# Patient Record
Sex: Male | Born: 1970 | Race: White | Hispanic: No | Smoking: Former smoker
Health system: Southern US, Community
[De-identification: ages and names within clinical notes are randomized; demographics above are authoritative.]

## PROBLEM LIST (undated history)

## (undated) DIAGNOSIS — K449 Diaphragmatic hernia without obstruction or gangrene: Secondary | ICD-10-CM

## (undated) DIAGNOSIS — K219 Gastro-esophageal reflux disease without esophagitis: Secondary | ICD-10-CM

## (undated) DIAGNOSIS — R81 Glycosuria: Secondary | ICD-10-CM

## (undated) DIAGNOSIS — K222 Esophageal obstruction: Secondary | ICD-10-CM

## (undated) HISTORY — DX: Esophageal obstruction: K22.2

## (undated) HISTORY — DX: Diaphragmatic hernia without obstruction or gangrene: K44.9

## (undated) HISTORY — PX: TONSILLECTOMY: SUR1361

## (undated) HISTORY — PX: FOOT SURGERY: SHX648

---

## 2006-07-27 ENCOUNTER — Ambulatory Visit (HOSPITAL_COMMUNITY): Admission: RE | Admit: 2006-07-27 | Discharge: 2006-07-27 | Payer: Self-pay | Admitting: Internal Medicine

## 2006-07-27 ENCOUNTER — Ambulatory Visit: Payer: Self-pay | Admitting: Internal Medicine

## 2006-07-29 ENCOUNTER — Inpatient Hospital Stay (HOSPITAL_BASED_OUTPATIENT_CLINIC_OR_DEPARTMENT_OTHER): Admission: RE | Admit: 2006-07-29 | Discharge: 2006-07-29 | Payer: Self-pay | Admitting: Internal Medicine

## 2006-07-29 ENCOUNTER — Ambulatory Visit: Payer: Self-pay | Admitting: Internal Medicine

## 2006-08-07 ENCOUNTER — Ambulatory Visit: Payer: Self-pay | Admitting: Cardiology

## 2013-01-21 ENCOUNTER — Encounter: Payer: Self-pay | Admitting: *Deleted

## 2013-01-21 ENCOUNTER — Encounter: Payer: Self-pay | Admitting: Gastroenterology

## 2013-01-21 ENCOUNTER — Emergency Department
Admission: EM | Admit: 2013-01-21 | Discharge: 2013-01-21 | Disposition: A | Discharge status: Home / Self Care | Payer: Self-pay | Source: Home / Self Care | Attending: Family Medicine | Admitting: Family Medicine

## 2013-01-21 ENCOUNTER — Other Ambulatory Visit (HOSPITAL_COMMUNITY): Payer: Managed Care, Other (non HMO)

## 2013-01-21 DIAGNOSIS — R1310 Dysphagia, unspecified: Secondary | ICD-10-CM

## 2013-01-21 HISTORY — DX: Gastro-esophageal reflux disease without esophagitis: K21.9

## 2013-01-21 HISTORY — DX: Glycosuria: R81

## 2013-01-21 MED ORDER — PANTOPRAZOLE SODIUM 40 MG PO TBEC: 40.0000 mg | DELAYED_RELEASE_TABLET | Freq: Two times a day (BID) | ORAL | Status: DC

## 2013-01-21 NOTE — ED Notes (Signed)
appt sch'ed with Dr. Jarold Motto GI for May 15 @ 0900. Pt notified of the appt.

## 2013-01-21 NOTE — ED Notes (Signed)
Pt sch'ed for Barium Swallow today @ 1300 at [facility]. Pt notified of the appt.

## 2013-01-21 NOTE — ED Notes (Signed)
Pt c/o dysphagia and GERD x . He has taken omeprazole 20mg  QD with minimal relief.

## 2013-01-21 NOTE — ED Provider Notes (Signed)
History     CSN: [id number]  Arrival date & time 01/21/13  0905   First MD Initiated Contact with Patient 01/21/13 [phone]      Chief Complaint  Patient presents with  . Dysphagia   HPI  Patient presents today with chief complaint of progressive dysphagia over the past 6 months. Patient states he has a history of poorly controlled reflux that he's been using intermittent over-the-counter medication for the past. Patient states she's had difficulty eating solid foods over the past 6 months has seemed to progressively worsen over the past month. Patient states that every time he tries to eat something like steak he gets a severe feeling of that the food is stuck in his throat and has had to drink high amounts of water to help clear the food. Patient has sometimes vomit the food to help clear it. Patient denies any fevers or chills. Patient is nonalcoholic. Quit smoking about 4-5 years ago. No unintentional weight loss.  patient states he's been using every other day Prilosec for symptoms in this has seemed to somewhat help, however he still has episodes where he has to wake up in the night and vomited because of too much stomach acid. Patient is not been formally evaluated by gastroenterology for this. Patient denies any issues swallowing liquids, issues been predominantly with solid foods. Past Medical History  Diagnosis Date  . Glycosuria   . GERD (gastroesophageal reflux disease)     Past Surgical History  Procedure Laterality Date  . Tonsillectomy    . Foot surgery Right     History reviewed. No pertinent family history.  History  Substance Use Topics  . Smoking status: Former Games developer  . Smokeless tobacco: Not on file  . Alcohol Use: Yes      Review of Systems  All other systems reviewed and are negative.    Allergies  Erythromycin  Home Medications   Current Outpatient Rx  Name  Route  Sig  Dispense  Refill  . ibuprofen (ADVIL,MOTRIN) 200 MG tablet   Oral  Take 200 mg by mouth every 6 (six) hours as needed for pain.         Marland Kitchen omeprazole (PRILOSEC) 20 MG capsule   Oral   Take 20 mg by mouth daily.           BP 135/83  Pulse 80  Temp(Src) 98.3 F (36.8 C) (Oral)  Resp 18  Ht 5' 9.75" (1.772 m)  Wt 195 lb (88.451 kg)  BMI 28.17 kg/m2  SpO2 98%  Physical Exam  Constitutional: He appears well-developed and well-nourished.  HENT:  Head: Normocephalic and atraumatic.  Right Ear: External ear normal.  Left Ear: External ear normal.  Mouth/Throat: Oropharynx is clear and moist.  Eyes: Pupils are equal, round, and reactive to light.  Neck: Normal range of motion. Neck supple. No tracheal deviation present. No thyromegaly present.  Cardiovascular: Normal rate and normal heart sounds.   Pulmonary/Chest: Effort normal.  Abdominal: Soft. Bowel sounds are normal. He exhibits no distension. There is no tenderness. There is no rebound and no guarding.  Musculoskeletal: Normal range of motion.  Neurological: He is alert.  Skin: Skin is warm.    ED Course  Procedures (including critical care time)  Labs Reviewed - No data to display No results found.   1. Dysphagia       MDM  Symptoms seem to be most consistent with esophageal dysphasia. Suspect that this is related to chronic heartburn. Differential diagnosis  also includes motility disorders. Will start workup with a barium swallow pending gastroenterology followup. Will place patient on high-dose protonix. as well as a full liquid diet pending GI followup. Discussed general and GI red flags. No hematemesis or other red flags on history/exam. Followup as needed.    The patient and/or caregiver has been counseled thoroughly with regard to treatment plan and/or medications prescribed including dosage, schedule, interactions, rationale for use, and possible side effects and they verbalize understanding. Diagnoses and expected course of recovery discussed and will return if not  improved as expected or if the condition worsens. Patient and/or caregiver verbalized understanding.            Doree Albee, MD 01/21/13 [phone]

## 2013-01-24 ENCOUNTER — Other Ambulatory Visit (HOSPITAL_COMMUNITY): Payer: Managed Care, Other (non HMO)

## 2013-01-24 ENCOUNTER — Ambulatory Visit (HOSPITAL_COMMUNITY): Payer: Managed Care, Other (non HMO)

## 2013-01-24 ENCOUNTER — Ambulatory Visit (HOSPITAL_COMMUNITY)
Admission: RE | Admit: 2013-01-24 | Discharge: 2013-01-24 | Disposition: A | Discharge status: Home / Self Care | Payer: Managed Care, Other (non HMO) | Source: Ambulatory Visit | Attending: Family Medicine | Admitting: Family Medicine

## 2013-01-24 DIAGNOSIS — K228 Other specified diseases of esophagus: Secondary | ICD-10-CM | POA: Insufficient documentation

## 2013-01-24 DIAGNOSIS — R1310 Dysphagia, unspecified: Secondary | ICD-10-CM | POA: Insufficient documentation

## 2013-01-24 DIAGNOSIS — K219 Gastro-esophageal reflux disease without esophagitis: Secondary | ICD-10-CM | POA: Insufficient documentation

## 2013-01-24 DIAGNOSIS — K2289 Other specified disease of esophagus: Secondary | ICD-10-CM | POA: Insufficient documentation

## 2013-02-01 ENCOUNTER — Encounter: Payer: Self-pay | Admitting: *Deleted

## 2013-02-10 ENCOUNTER — Encounter: Payer: Self-pay | Admitting: Gastroenterology

## 2013-02-10 ENCOUNTER — Other Ambulatory Visit (INDEPENDENT_AMBULATORY_CARE_PROVIDER_SITE_OTHER): Payer: Managed Care, Other (non HMO)

## 2013-02-10 ENCOUNTER — Ambulatory Visit (INDEPENDENT_AMBULATORY_CARE_PROVIDER_SITE_OTHER): Payer: Managed Care, Other (non HMO) | Admitting: Gastroenterology

## 2013-02-10 VITALS — BP 120/80 | HR 72 | Ht 69.5 in | Wt 197.5 lb

## 2013-02-10 DIAGNOSIS — K219 Gastro-esophageal reflux disease without esophagitis: Secondary | ICD-10-CM

## 2013-02-10 DIAGNOSIS — R1310 Dysphagia, unspecified: Secondary | ICD-10-CM

## 2013-02-10 LAB — COMPREHENSIVE METABOLIC PANEL
ALT: 29 U/L (ref 0–53)
Alkaline Phosphatase: 50 U/L (ref 39–117)
CO2: 31 mEq/L (ref 19–32)
Creatinine, Ser: 1 mg/dL (ref 0.4–1.5)
GFR: 86.17 mL/min (ref >60.00)
Total Bilirubin: 0.6 mg/dL (ref 0.3–1.2)

## 2013-02-10 LAB — CBC WITH DIFFERENTIAL/PLATELET
Eosinophils Relative: 2.4 % (ref 0.0–5.0)
HCT: 44.2 % (ref 39.0–52.0)
Hemoglobin: 15.3 g/dL (ref 13.0–17.0)
Lymphs Abs: 1.4 10*3/uL (ref 0.7–4.0)
Monocytes Relative: 8.1 % (ref 3.0–12.0)
Neutro Abs: 5.1 10*3/uL (ref 1.4–7.7)
RDW: 12.8 % (ref 11.5–14.6)
WBC: 7.3 10*3/uL (ref 4.5–10.5)

## 2013-02-10 LAB — IBC PANEL
Saturation Ratios: 22 % (ref 20.0–50.0)
Transferrin: 262.6 mg/dL (ref 212.0–360.0)

## 2013-02-10 LAB — VITAMIN B12: Vitamin B-12: 379 pg/mL (ref 211–911)

## 2013-02-10 LAB — HEPATIC FUNCTION PANEL
ALT: 29 U/L (ref 0–53)
Albumin: 4.2 g/dL (ref 3.5–5.2)
Bilirubin, Direct: 0.1 mg/dL (ref 0.0–0.3)
Total Protein: 7.1 g/dL (ref 6.0–8.3)

## 2013-02-10 LAB — FERRITIN: Ferritin: 85.7 ng/mL (ref 22.0–322.0)

## 2013-02-10 MED ORDER — DEXLANSOPRAZOLE 60 MG PO CPDR: 60.0000 mg | DELAYED_RELEASE_CAPSULE | Freq: Every day | ORAL | Status: DC

## 2013-02-10 NOTE — Patient Instructions (Addendum)
  You have been scheduled for an endoscopy with propofol. Please follow written instructions given to you at your visit today. If you use inhalers (even only as needed), please bring them with you on the day of your procedure. Your physician has requested that you go to www.startemmi.com and enter the access code given to you at your visit today. This web site gives a general overview about your procedure. However, you should still follow specific instructions given to you by our office regarding your preparation for the procedure.  Your physician has requested that you go to the basement for lab work before leaving today.   We have sent the following medications to your pharmacy for you to pick up at your convenience: Stop the Protonix's and start Dexilant. Dexilant 60 mg, please take one capsule by mouth once daily. _____________________________________________________________________________                                               We are excited to introduce MyChart, a new best-in-class service that provides you online access to important information in your electronic medical record. We want to make it easier for you to view your health information - all in one secure location - when and where you need it. We expect MyChart will enhance the quality of care and service we provide.  When you register for MyChart, you can:    View your test results.    Request appointments and receive appointment reminders via email.    Request medication renewals.    View your medical history, allergies, medications and immunizations.    Communicate with your physician's office through a password-protected site.    Conveniently print information such as your medication lists.  To find out if MyChart is right for you, please talk to a member of our clinical staff today. We will gladly answer your questions about this free health and wellness tool.  If you are age 39 or older and want a member  of your family to have access to your record, you must provide written consent by completing a proxy form available at our office. Please speak to our clinical staff about guidelines regarding accounts for patients younger than age 41.  As you activate your MyChart account and need any technical assistance, please call the MyChart technical support line at [phone]) or email your question to [email or web address]. If you email your question(s), please include your name, a return phone number and the best time to reach you.  If you have non-urgent health-related questions, you can send a message to our office through MyChart at [email or web address]. If you have a medical emergency, call 911.  Thank you for using MyChart as your new health and wellness resource!   MyChart licensed from [employer],  [phone]. Patents Pending.

## 2013-02-10 NOTE — Progress Notes (Addendum)
History of Present Illness:  This is a  very pleasant 42 year old Caucasian male who has had acid reflux for 20 years.  treated with  over-the-counter Prilosec with minimal success, also when necessary Pepcid AC.  Over the last 6 months he's had episodes of rather severe dysphagia for solid foods with associated choking and emesis.  Recent barium swallow showed what appeared to be a Schatzki's ring distal esophagus without other abnormalities.  Currently is on Protonix daily with rather marked improvement in his acid reflux symptoms of regurgitation and burning substernal chest pain, but he continues with nocturnal symptoms and also has to be very careful about his diet in terms of dysphagia difficulties.  5 years ago he has stress related chest pain, and apparently had a negative cardiac angiogram.  The patient did quit smoking 5 years ago, and denies ethanol abuse, but does take 2 Advil a day for various aches and pains.  There is no history of Raynaud's phenomenon or other chronic medical problems, but the patient does not go to the doctor very often.  Family history is remarkable for acid reflux in his grandmother.  There is no known history of gallbladder/ liver disease.  I have reviewed this patient's present history, medical and surgical past history, allergies and medications.     ROS:   All systems were reviewed and are negative unless otherwise stated in the HPI.    Physical Exam: Blood pressure 120/80, pulse 72 and regular, and weight 197 with a BMI of 28.76.  97% saturation on room air. General well developed well nourished patient in no acute distress, appearing their stated age Eyes PERRLA, no icterus, fundoscopic exam per opthamologist Skin no lesions noted Neck supple, no adenopathy, no thyroid enlargement, no tenderness Chest clear to percussion and auscultation Heart no significant murmurs, gallops or rubs noted Abdomen no hepatosplenomegaly masses or tenderness, BS normal.   Extremities no acute joint lesions, edema, phlebitis or evidence of cellulitis. Neurologic patient oriented x 3, cranial nerves intact, no focal neurologic deficits noted. Psychological mental status normal and normal affect.  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  I have switched him to Dexilant 60 mg a day, have shown him patient education material concerning GERD, and scheduled endoscopy with probable esophageal biopsy and dilatation.  He is been placed on a dysphagia diet pending further therapy.  I have ordered basic routine labs for review.  He has no symptoms of a neuromuscular disorder or Raynaud's phenomenon.  Encounter Diagnoses  Name Primary?  . GERD (gastroesophageal reflux disease) Yes  . Schatzki's ring

## 2013-02-23 ENCOUNTER — Encounter: Payer: Self-pay | Admitting: Gastroenterology

## 2013-02-23 ENCOUNTER — Other Ambulatory Visit: Payer: Self-pay | Admitting: *Deleted

## 2013-02-23 ENCOUNTER — Ambulatory Visit (AMBULATORY_SURGERY_CENTER): Payer: Managed Care, Other (non HMO) | Admitting: Gastroenterology

## 2013-02-23 VITALS — BP 130/89 | HR 72 | Temp 98.4°F | Resp 17 | Ht 69.0 in | Wt 197.0 lb

## 2013-02-23 DIAGNOSIS — Q391 Atresia of esophagus with tracheo-esophageal fistula: Secondary | ICD-10-CM

## 2013-02-23 DIAGNOSIS — K222 Esophageal obstruction: Secondary | ICD-10-CM

## 2013-02-23 DIAGNOSIS — K2990 Gastroduodenitis, unspecified, without bleeding: Secondary | ICD-10-CM

## 2013-02-23 DIAGNOSIS — K2970 Gastritis, unspecified, without bleeding: Secondary | ICD-10-CM

## 2013-02-23 DIAGNOSIS — K219 Gastro-esophageal reflux disease without esophagitis: Secondary | ICD-10-CM

## 2013-02-23 MED ORDER — PANTOPRAZOLE SODIUM 40 MG PO TBEC: DELAYED_RELEASE_TABLET | ORAL | Status: DC

## 2013-02-23 MED ORDER — ESOMEPRAZOLE MAGNESIUM 40 MG PO CPDR: DELAYED_RELEASE_CAPSULE | ORAL | Status: DC

## 2013-02-23 MED ORDER — SODIUM CHLORIDE 0.9 % IV SOLN: 500.0000 mL | INTRAVENOUS | Status: DC

## 2013-02-23 NOTE — Patient Instructions (Addendum)
YOU HAD AN ENDOSCOPIC PROCEDURE TODAY AT THE [facility]: Refer to the procedure report that was given to you for any specific questions about what was found during the examination.  If the procedure report does not answer your questions, please call your gastroenterologist to clarify.  If you requested that your care partner not be given the details of your procedure findings, then the procedure report has been included in a sealed envelope for you to review at your convenience later.  YOU SHOULD EXPECT: Some feelings of bloating in the abdomen. Passage of more gas than usual.  Walking can help get rid of the air that was put into your GI tract during the procedure and reduce the bloating. If you had a lower endoscopy (such as a colonoscopy or flexible sigmoidoscopy) you may notice spotting of blood in your stool or on the toilet paper. If you underwent a bowel prep for your procedure, then you may not have a normal bowel movement for a few days.  DIET: See Dilation Diet-Drink plenty of fluids but you should avoid alcoholic beverages for 24 hours.  ACTIVITY: Your care partner should take you home directly after the procedure.  You should plan to take it easy, moving slowly for the rest of the day.  You can resume normal activity the day after the procedure however you should NOT DRIVE or use heavy machinery for 24 hours (because of the sedation medicines used during the test).    SYMPTOMS TO REPORT IMMEDIATELY: A gastroenterologist can be reached at any hour.  During normal business hours, 8:30 AM to 5:00 PM Monday through Friday, call [phone].  After hours and on weekends, please call the GI answering service at [phone] who will take a message and have the physician on call contact you.   Following upper endoscopy (EGD)  Vomiting of blood or coffee ground material  New chest pain or pain under the shoulder blades  Painful or persistently difficult swallowing  New  shortness of breath  Fever of 100F or higher  Black, tarry-looking stools  FOLLOW UP: If any biopsies were taken you will be contacted by phone or by letter within the next 1-3 weeks.  Call your gastroenterologist if you have not heard about the biopsies in 3 weeks.  Our staff will call the home number listed on your records the next business day following your procedure to check on you and address any questions or concerns that you may have at that time regarding the information given to you following your procedure. This is a courtesy call and so if there is no answer at the home number and we have not heard from you through the emergency physician on call, we will assume that you have returned to your regular daily activities without incident.  SIGNATURES/CONFIDENTIALITY: You and/or your care partner have signed paperwork which will be entered into your electronic medical record.  These signatures attest to the fact that that the information above on your After Visit Summary has been reviewed and is understood.  Full responsibility of the confidentiality of this discharge information lies with you and/or your care-partner.  Hiatal hernia, gastritis, dilation diet-handouts given  Dilate as needed  Office visit for 1 month, if office has not called by 5/30 please call and schedule

## 2013-02-23 NOTE — Op Note (Signed)
[facility] [address] [place], [address]   ENDOSCOPY PROCEDURE REPORT  PATIENT: Staley, Lunz  MR#: [id number] BIRTHDATE: 1971-08-06 , 42  yrs. old GENDER: Male ENDOSCOPIST:Loralie Malta Hale Bogus, MD, [facility] REFERRED BY: PROCEDURE DATE:  02/23/2013 PROCEDURE:   EGD w/ biopsy for H.pylori and Maloney dilation of esophagus ASA CLASS:    Class II INDICATIONS: Dysphagia and History of reflux esophagitis. MEDICATION: propofol (Diprivan) 150mg  IV TOPICAL ANESTHETIC:   Cetacaine Spray  DESCRIPTION OF PROCEDURE:   After the risks and benefits of the procedure were explained, informed consent was obtained.  The LB [id number]  endoscope was introduced through the mouth  and advanced to the second portion of the duodenum .  The instrument was slowly withdrawn as the mucosa was fully examined.      DUODENUM: The duodenal mucosa showed no abnormalities in the bulb and second portion of the duodenum.  STOMACH: There was mild antral gastropathy noted.  Cold forcep biopsies were taken at the antrum. for CLO testing.  ESOPHAGUS: A 2 cm hiatal hernia was noted.   A Schatzki ring was found at the gastroesophageal junction. Dilated #64F Maloney dilator...no heme or pain.No Barrett's mucosa noted./   Retroflexed views revealed a hiatal hernia.    The scope was then withdrawn from the patient and the procedure completed.  COMPLICATIONS: There were no complications.   ENDOSCOPIC IMPRESSION: 1.   The duodenal mucosa showed no abnormalities in the bulb and second portion of the duodenum 2.   There was mild antral gastropathy noted [T2] ..r/o H.pylori 3.   2 cm hiatal hernia noted 4.   Schatzki ring was found at the gastroesophageal junction dilated,"give" noted,no heme or pain.  RECOMMENDATIONS: 1.  Await pathology results 2.  Continue current meds 3. Dilate prn    3.  Clear liquids for 6 hours, call for chest pain.  Soft diet today, resume regular diet  tomorrow.Dilate in future as needed. $. OV 1 month for F/U   _______________________________ eSigned:  Mardella Layman, MD, [facility] 02/23/2013 10:44 AM   standard discharge   PATIENT NAME:  Axle, Parfait MR#: [id number]

## 2013-02-23 NOTE — Progress Notes (Signed)
Patient did not experience any of the following events: a burn prior to discharge; a fall within the facility; wrong site/side/patient/procedure/implant event; or a hospital transfer or hospital admission upon discharge from the facility. (G8907) Patient did not have preoperative order for IV antibiotic SSI prophylaxis. (G8918)  

## 2013-02-24 ENCOUNTER — Telehealth: Payer: Self-pay | Admitting: *Deleted

## 2013-02-24 NOTE — Telephone Encounter (Signed)
Number identifier, left message, follow-up  

## 2013-02-25 ENCOUNTER — Encounter: Payer: Self-pay | Admitting: Gastroenterology

## 2013-03-17 ENCOUNTER — Encounter: Payer: Self-pay | Admitting: *Deleted

## 2013-03-22 ENCOUNTER — Encounter: Payer: Self-pay | Admitting: Gastroenterology

## 2013-03-22 ENCOUNTER — Ambulatory Visit (INDEPENDENT_AMBULATORY_CARE_PROVIDER_SITE_OTHER): Payer: Managed Care, Other (non HMO) | Admitting: Gastroenterology

## 2013-03-22 VITALS — BP 120/86 | HR 92 | Ht 69.5 in | Wt 197.4 lb

## 2013-03-22 DIAGNOSIS — Q391 Atresia of esophagus with tracheo-esophageal fistula: Secondary | ICD-10-CM

## 2013-03-22 DIAGNOSIS — R1319 Other dysphagia: Secondary | ICD-10-CM

## 2013-03-22 DIAGNOSIS — Q393 Congenital stenosis and stricture of esophagus: Secondary | ICD-10-CM

## 2013-03-22 DIAGNOSIS — K222 Esophageal obstruction: Secondary | ICD-10-CM

## 2013-03-22 DIAGNOSIS — K219 Gastro-esophageal reflux disease without esophagitis: Secondary | ICD-10-CM

## 2013-03-22 MED ORDER — PANTOPRAZOLE SODIUM 40 MG PO TBEC: DELAYED_RELEASE_TABLET | ORAL | Status: DC

## 2013-03-22 NOTE — Progress Notes (Signed)
History of Present Illness: This is a 42 year old Caucasian male with chronic GERD he had recent endoscopy and dilatation of a Schatzki's reign his distal esophagus.  Biopsies for H. pylori were negative.  Preoperative lab work was all unremarkable including an anemia profile.  Since his procedure he is completely asymptomatic without dysphagia, and he is on twice a day Protonix without any acid reflux symptoms.  He denies lower GI or other general medical problems.    Current Medications, Allergies, Past Medical History, Past Surgical History, Family History and Social History were reviewed in [employer] record.  ROS: All systems were reviewed and are negative unless otherwise stated in the HPI.         Assessment and plan: Chronic reflux regime review with patient.  We will continue twice a day Protonix 40 mg for 2 months then decrease to once a day before bedtime as tolerated.  He is to call quickly if he has any recurrence of his dysphagia.  Otherwise the patient appears healthy and we will follow him as needed No diagnosis found.

## 2013-03-22 NOTE — Patient Instructions (Signed)
  Please follow up with Dr Jarold Motto in one year.  For two months take Protonix twice daily, then after that go back to one daily before dinner. _______________________________________________________________________________________________________________________________________                                               We are excited to introduce MyChart, a new best-in-class service that provides you online access to important information in your electronic medical record. We want to make it easier for you to view your health information - all in one secure location - when and where you need it. We expect MyChart will enhance the quality of care and service we provide.  When you register for MyChart, you can:    View your test results.    Request appointments and receive appointment reminders via email.    Request medication renewals.    View your medical history, allergies, medications and immunizations.    Communicate with your physician's office through a password-protected site.    Conveniently print information such as your medication lists.  To find out if MyChart is right for you, please talk to a member of our clinical staff today. We will gladly answer your questions about this free health and wellness tool.  If you are age 44 or older and want a member of your family to have access to your record, you must provide written consent by completing a proxy form available at our office. Please speak to our clinical staff about guidelines regarding accounts for patients younger than age 44.  As you activate your MyChart account and need any technical assistance, please call the MyChart technical support line at [phone]) or email your question to [email or web address]. If you email your question(s), please include your name, a return phone number and the best time to reach you.  If you have non-urgent health-related questions, you can send a message to  our office through MyChart at [email or web address]. If you have a medical emergency, call 911.  Thank you for using MyChart as your new health and wellness resource!   MyChart licensed from [employer],  [id number]. Patents Pending.

## 2013-06-07 ENCOUNTER — Other Ambulatory Visit: Payer: Self-pay | Admitting: *Deleted

## 2013-06-07 MED ORDER — PANTOPRAZOLE SODIUM 40 MG PO TBEC: 40.0000 mg | DELAYED_RELEASE_TABLET | Freq: Every day | ORAL | Status: AC

## 2013-06-07 NOTE — Telephone Encounter (Signed)
Via fax from [facility] Protonix is not covered for twice daily only once daily I called patient and patient said he is down to once daily on his Protonix per Dr. Norval Gable request. Because for two months he was suppose to take Protonix twice daily, then go down to once a day Changed RX to once daily

## 2014-12-24 ENCOUNTER — Emergency Department (HOSPITAL_COMMUNITY): Payer: Managed Care, Other (non HMO)

## 2014-12-24 ENCOUNTER — Emergency Department (HOSPITAL_COMMUNITY)
Admission: EM | Admit: 2014-12-24 | Discharge: 2014-12-24 | Disposition: A | Discharge status: Home / Self Care | Payer: Managed Care, Other (non HMO) | Source: Home / Self Care | Attending: Family Medicine | Admitting: Family Medicine

## 2014-12-24 ENCOUNTER — Encounter (HOSPITAL_COMMUNITY): Payer: Self-pay | Admitting: Emergency Medicine

## 2014-12-24 ENCOUNTER — Emergency Department (HOSPITAL_COMMUNITY)
Admission: EM | Admit: 2014-12-24 | Discharge: 2014-12-25 | Disposition: A | Discharge status: Home / Self Care | Payer: Managed Care, Other (non HMO) | Attending: Emergency Medicine | Admitting: Emergency Medicine

## 2014-12-24 DIAGNOSIS — J029 Acute pharyngitis, unspecified: Secondary | ICD-10-CM | POA: Insufficient documentation

## 2014-12-24 DIAGNOSIS — G9340 Encephalopathy, unspecified: Secondary | ICD-10-CM | POA: Diagnosis not present

## 2014-12-24 DIAGNOSIS — Z79899 Other long term (current) drug therapy: Secondary | ICD-10-CM | POA: Diagnosis not present

## 2014-12-24 DIAGNOSIS — Z87891 Personal history of nicotine dependence: Secondary | ICD-10-CM | POA: Insufficient documentation

## 2014-12-24 DIAGNOSIS — R05 Cough: Secondary | ICD-10-CM | POA: Diagnosis not present

## 2014-12-24 DIAGNOSIS — R059 Cough, unspecified: Secondary | ICD-10-CM

## 2014-12-24 DIAGNOSIS — R030 Elevated blood-pressure reading, without diagnosis of hypertension: Secondary | ICD-10-CM | POA: Diagnosis not present

## 2014-12-24 DIAGNOSIS — R200 Anesthesia of skin: Secondary | ICD-10-CM | POA: Insufficient documentation

## 2014-12-24 DIAGNOSIS — K219 Gastro-esophageal reflux disease without esophagitis: Secondary | ICD-10-CM | POA: Insufficient documentation

## 2014-12-24 DIAGNOSIS — Q393 Congenital stenosis and stricture of esophagus: Secondary | ICD-10-CM | POA: Diagnosis not present

## 2014-12-24 DIAGNOSIS — R410 Disorientation, unspecified: Secondary | ICD-10-CM | POA: Diagnosis not present

## 2014-12-24 DIAGNOSIS — R509 Fever, unspecified: Secondary | ICD-10-CM

## 2014-12-24 DIAGNOSIS — T50905A Adverse effect of unspecified drugs, medicaments and biological substances, initial encounter: Secondary | ICD-10-CM

## 2014-12-24 DIAGNOSIS — T483X5A Adverse effect of antitussives, initial encounter: Secondary | ICD-10-CM | POA: Insufficient documentation

## 2014-12-24 LAB — CBC
HEMATOCRIT: 42.4 % (ref 39.0–52.0)
Hemoglobin: 14.7 g/dL (ref 13.0–17.0)
MCH: 29.9 pg (ref 26.0–34.0)
MCHC: 34.7 g/dL (ref 30.0–36.0)
MCV: 86.4 fL (ref 78.0–100.0)
Platelets: 150 10*3/uL (ref 150–400)
RBC: 4.91 MIL/uL (ref 4.22–5.81)
RDW: 12.8 % (ref 11.5–15.5)
WBC: 4.9 10*3/uL (ref 4.0–10.5)

## 2014-12-24 LAB — I-STAT CG4 LACTIC ACID, ED: Lactic Acid, Venous: 2.22 mmol/L (ref 0.5–2.0)

## 2014-12-24 LAB — POCT RAPID STREP A: STREPTOCOCCUS, GROUP A SCREEN (DIRECT): NEGATIVE

## 2014-12-24 MED ORDER — HYDROCOD POLST-CHLORPHEN POLST 10-8 MG/5ML PO LQCR: 5.0000 mL | Freq: Two times a day (BID) | ORAL | Status: AC | PRN

## 2014-12-24 NOTE — Discharge Instructions (Signed)
Cough, Adult  A cough is a reflex that helps clear your throat and airways. It can help heal the body or may be a reaction to an irritated airway. A cough may only last 2 or 3 weeks (acute) or may last more than 8 weeks (chronic).  CAUSES Acute cough:  Viral or bacterial infections. Chronic cough:  Infections.  Allergies.  Asthma.  Post-nasal drip.  Smoking.  Heartburn or acid reflux.  Some medicines.  Chronic lung problems (COPD).  Cancer. SYMPTOMS   Cough.  Fever.  Chest pain.  Increased breathing rate.  High-pitched whistling sound when breathing (wheezing).  Colored mucus that you cough up (sputum). TREATMENT   A bacterial cough may be treated with antibiotic medicine.  A viral cough must run its course and will not respond to antibiotics.  Your caregiver may recommend other treatments if you have a chronic cough. HOME CARE INSTRUCTIONS   Only take over-the-counter or prescription medicines for pain, discomfort, or fever as directed by your caregiver. Use cough suppressants only as directed by your caregiver.  Use a cold steam vaporizer or humidifier in your bedroom or home to help loosen secretions.  Sleep in a semi-upright position if your cough is worse at night.  Rest as needed.  Stop smoking if you smoke. SEEK IMMEDIATE MEDICAL CARE IF:   You have pus in your sputum.  Your cough starts to worsen.  You cannot control your cough with suppressants and are losing sleep.  You begin coughing up blood.  You have difficulty breathing.  You develop pain which is getting worse or is uncontrolled with medicine.  You have a fever. MAKE SURE YOU:   Understand these instructions.  Will watch your condition.  Will get help right away if you are not doing well or get worse. Document Released: 03/14/2011 Document Revised: 12/08/2011 Document Reviewed: 03/14/2011 [facility] Patient Information 2015 [place]. This information is not intended  to replace advice given to you by your health care provider. Make sure you discuss any questions you have with your health care provider.  Fever, Adult A fever is a higher than normal body temperature. In an adult, an oral temperature around 98.6 F (37 C) is considered normal. A temperature of 100.4 F (38 C) or higher is generally considered a fever. Mild or moderate fevers generally have no long-term effects and often do not require treatment. Extreme fever (greater than or equal to 106 F or 41.1 C) can cause seizures. The sweating that may occur with repeated or prolonged fever may cause dehydration. Elderly people can develop confusion during a fever. A measured temperature can vary with:  Age.  Time of day.  Method of measurement (mouth, underarm, rectal, or ear). The fever is confirmed by taking a temperature with a thermometer. Temperatures can be taken different ways. Some methods are accurate and some are not.  An oral temperature is used most commonly. Electronic thermometers are fast and accurate.  An ear temperature will only be accurate if the thermometer is positioned as recommended by the manufacturer.  A rectal temperature is accurate and done for those adults who have a condition where an oral temperature cannot be taken.  An underarm (axillary) temperature is not accurate and not recommended. Fever is a symptom, not a disease.  CAUSES   Infections commonly cause fever.  Some noninfectious causes for fever include:  Some arthritis conditions.  Some thyroid or adrenal gland conditions.  Some immune system conditions.  Some types of cancer.  A medicine reaction.  High doses of certain street drugs such as methamphetamine.  Dehydration.  Exposure to high outside or room temperatures.  Occasionally, the source of a fever cannot be determined. This is sometimes called a "fever of unknown origin" (FUO).  Some situations may lead to a temporary rise in body  temperature that may go away on its own. Examples are:  Childbirth.  Surgery.  Intense exercise. HOME CARE INSTRUCTIONS   Take appropriate medicines for fever. Follow dosing instructions carefully. If you use acetaminophen to reduce the fever, be careful to avoid taking other medicines that also contain acetaminophen. Do not take aspirin for a fever if you are younger than age 35. There is an association with Reye's syndrome. Reye's syndrome is a rare but potentially deadly disease.  If an infection is present and antibiotics have been prescribed, take them as directed. Finish them even if you start to feel better.  Rest as needed.  Maintain an adequate fluid intake. To prevent dehydration during an illness with prolonged or recurrent fever, you may need to drink extra fluid.Drink enough fluids to keep your urine clear or pale yellow.  Sponging or bathing with room temperature water may help reduce body temperature. Do not use ice water or alcohol sponge baths.  Dress comfortably, but do not over-bundle. SEEK MEDICAL CARE IF:   You are unable to keep fluids down.  You develop vomiting or diarrhea.  You are not feeling at least partly better after 3 days.  You develop new symptoms or problems. SEEK IMMEDIATE MEDICAL CARE IF:   You have shortness of breath or trouble breathing.  You develop excessive weakness.  You are dizzy or you faint.  You are extremely thirsty or you are making little or no urine.  You develop new pain that was not there before (such as in the head, neck, chest, back, or abdomen).  You have persistent vomiting and diarrhea for more than 1 to 2 days.  You develop a stiff neck or your eyes become sensitive to light.  You develop a skin rash.  You have a fever or persistent symptoms for more than 2 to 3 days.  You have a fever and your symptoms suddenly get worse. MAKE SURE YOU:   Understand these instructions.  Will watch your  condition.  Will get help right away if you are not doing well or get worse. Document Released: 03/11/2001 Document Revised: 01/30/2014 Document Reviewed: 07/17/2011 [facility] Patient Information 2015 [place], [place]. This information is not intended to replace advice given to you by your health care provider. Make sure you discuss any questions you have with your health care provider.  Pharyngitis Pharyngitis is redness, pain, and swelling (inflammation) of your pharynx.  CAUSES  Pharyngitis is usually caused by infection. Most of the time, these infections are from viruses (viral) and are part of a cold. However, sometimes pharyngitis is caused by bacteria (bacterial). Pharyngitis can also be caused by allergies. Viral pharyngitis may be spread from person to person by coughing, sneezing, and personal items or utensils (cups, forks, spoons, toothbrushes). Bacterial pharyngitis may be spread from person to person by more intimate contact, such as kissing.  SIGNS AND SYMPTOMS  Symptoms of pharyngitis include:   Sore throat.   Tiredness (fatigue).   Low-grade fever.   Headache.  Joint pain and muscle aches.  Skin rashes.  Swollen lymph nodes.  Plaque-like film on throat or tonsils (often seen with bacterial pharyngitis). DIAGNOSIS  Your health care provider  will ask you questions about your illness and your symptoms. Your medical history, along with a physical exam, is often all that is needed to diagnose pharyngitis. Sometimes, a rapid strep test is done. Other lab tests may also be done, depending on the suspected cause.  TREATMENT  Viral pharyngitis will usually get better in 3-4 days without the use of medicine. Bacterial pharyngitis is treated with medicines that kill germs (antibiotics).  HOME CARE INSTRUCTIONS   Drink enough water and fluids to keep your urine clear or pale yellow.   Only take over-the-counter or prescription medicines as directed by your health care  provider:   If you are prescribed antibiotics, make sure you finish them even if you start to feel better.   Do not take aspirin.   Get lots of rest.   Gargle with 8 oz of salt water ( tsp of salt per 1 qt of water) as often as every 1-2 hours to soothe your throat.   Throat lozenges (if you are not at risk for choking) or sprays may be used to soothe your throat. SEEK MEDICAL CARE IF:   You have large, tender lumps in your neck.  You have a rash.  You cough up green, yellow-brown, or bloody spit. SEEK IMMEDIATE MEDICAL CARE IF:   Your neck becomes stiff.  You drool or are unable to swallow liquids.  You vomit or are unable to keep medicines or liquids down.  You have severe pain that does not go away with the use of recommended medicines.  You have trouble breathing (not caused by a stuffy nose). MAKE SURE YOU:   Understand these instructions.  Will watch your condition.  Will get help right away if you are not doing well or get worse. Document Released: 09/15/2005 Document Revised: 07/06/2013 Document Reviewed: 05/23/2013 [facility] Patient Information 2015 [place], [place]. This information is not intended to replace advice given to you by your health care provider. Make sure you discuss any questions you have with your health care provider.  Salt Water Gargle This solution will help make your mouth and throat feel better. HOME CARE INSTRUCTIONS   Mix 1 teaspoon of salt in 8 ounces of warm water.  Gargle with this solution as much or often as you need or as directed. Swish and gargle gently if you have any sores or wounds in your mouth.  Do not swallow this mixture. Document Released: 06/19/2004 Document Revised: 12/08/2011 Document Reviewed: 11/10/2008 [facility] Patient Information 2015 [place], [place]. This information is not intended to replace advice given to you by your health care provider. Make sure you discuss any questions you have with your health care  provider.

## 2014-12-24 NOTE — ED Provider Notes (Signed)
CSN: [id number]     Arrival date & time 12/24/14  1708 History   First MD Initiated Contact with Patient 12/24/14 1812     Chief Complaint  Patient presents with  . Sore Throat   (Consider location/radiation/quality/duration/timing/severity/associated sxs/prior Treatment) HPI       44 year old male presents complaining of fever, dry cough, and severe sore throat. This started a few days ago. He had a temperature up to 103.6F at home over the weekend. The fever has persisted up to today. He is a cough that seems to be getting worse. He has been taking ibuprofen with some temporary relief of his symptoms. 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 No abdominal pain or NVD. No chest pain or shortness of breath. He looked in his throat and noted sores on his tongue and in the back of his throat. No risk for any STDs or history of any immune system problems   Past Medical History  Diagnosis Date  . Glycosuria   . GERD (gastroesophageal reflux disease)   . Schatzki's ring   . Hiatal hernia    Past Surgical History  Procedure Laterality Date  . Tonsillectomy    . Foot surgery Right    Family History  Problem Relation Age of Onset  . Heart disease Father    History  Substance Use Topics  . Smoking status: Former Games developermoker  . Smokeless tobacco: Never Used  . Alcohol Use: Yes    Review of Systems  Constitutional: Positive for fever, chills and fatigue.  HENT: Positive for mouth sores and sore throat. Negative for congestion and sinus pressure.   Respiratory: Positive for cough. Negative for shortness of breath.   Cardiovascular: Negative for chest pain.  Gastrointestinal: Negative for nausea, vomiting, abdominal pain and diarrhea.  Neurological: Positive for headaches.  All other systems reviewed and are negative.   Allergies  Erythromycin  Home Medications   Prior to Admission medications   Medication Sig Start Date End Date Taking?  Authorizing Provider  chlorpheniramine-HYDROcodone (TUSSIONEX PENNKINETIC ER) 10-8 MG/5ML LQCR Take 5 mLs by mouth every 12 (twelve) hours as needed for cough. 12/24/14   Graylon GoodZachary H Shaniyah Wix, PA-C  ibuprofen (ADVIL,MOTRIN) 200 MG tablet Take 200 mg by mouth every 6 (six) hours as needed for pain.    Historical Provider, MD  pantoprazole (PROTONIX) 40 MG tablet Take 1 tablet (40 mg total) by mouth daily. 06/07/13   Mardella Laymanavid R Patterson, MD   BP 178/108 mmHg  Pulse 114  Temp(Src) 99 F (37.2 C) (Oral)  Resp 18  SpO2 100% Physical Exam  Constitutional: He is oriented to person, place, and time. He appears well-developed and well-nourished. No distress.  HENT:  Head: Normocephalic and atraumatic.  Right Ear: External ear normal.  Left Ear: External ear normal.  Nose: Nose normal.  Mouth/Throat: Posterior oropharyngeal erythema present. No oropharyngeal exudate, posterior oropharyngeal edema or tonsillar abscesses.  There are 2 erythematous macular lesions on the distal tongue. Posterior pharynx therefore discrete erythematous ulcerations about 3 mm diameter.  Eyes: Conjunctivae are normal.  Neck: Normal range of motion.  Cardiovascular: Regular rhythm and normal heart sounds.  Tachycardia present.  Exam reveals no gallop and no friction rub.   No murmur heard. Pulmonary/Chest: Effort normal and breath sounds normal. No respiratory distress. He has no wheezes. He has no rales.  Lymphadenopathy:    He has no cervical adenopathy.  Neurological: He is alert and oriented to person, place,  and time. Coordination normal.  Skin: Skin is warm and dry. No rash noted. He is not diaphoretic.  Psychiatric: He has a normal mood and affect. Judgment normal.  Nursing note and vitals reviewed.   ED Course  Procedures (including critical care time) Labs Review Labs Reviewed  POCT RAPID STREP A ([facility] CARE ONLY)    Imaging Review No results found.   MDM   1. Ulcerative pharyngitis   2. Cough   3.  Fever, unspecified fever cause     Most likely viral given the appearance of his throat and his symptoms. He is here with his significant other who is asymptomatic but also has a negative strep test. Instructed in symptomatic management. Will provide a prescription for Tussionex for refractory pain or refractory cough   Meds ordered this encounter  Medications  . chlorpheniramine-HYDROcodone (TUSSIONEX PENNKINETIC ER) 10-8 MG/5ML LQCR    Sig: Take 5 mLs by mouth every 12 (twelve) hours as needed for cough.    Dispense:  115 mL    Refill:  0      Graylon Good, PA-C 12/24/14 1842

## 2014-12-24 NOTE — ED Provider Notes (Signed)
CSN: [id number]     Arrival date & time 12/24/14  2256 History   First MD Initiated Contact with Patient 12/24/14 2257     Chief Complaint  Patient presents with  . Numbness    Rightside  . Aphasia  . Hypertension    new onset  . Altered Mental Status    intermitten     (Consider location/radiation/quality/duration/timing/severity/associated sxs/prior Treatment) HPI Comments: 44 year old male here with altered mental status. Had intermittent episodes lasting about a minute of confusion, "spaciness". Went to urgent care this afternoon for 2 days of febrile illness with a sore throat. Had a negative strep test there. Took Delsym when he got home and then the intermittent confusion started. Afebrile here. He had an episode in front of me that appeared like him staring off into space, had difficulty getting words out.   Patient is a 44 y.o. male presenting with hypertension and altered mental status. The history is provided by the patient.  Hypertension Pertinent negatives include no abdominal pain and no shortness of breath.  Altered Mental Status Presenting symptoms: behavior changes, confusion and disorientation   Severity:  Mild Most recent episode:  Today Episode history:  Multiple Duration:  1 minute Timing:  Intermittent Progression:  Unchanged Chronicity:  New Context: recent illness (has had fever, sore throat.)   Context: not dementia   Associated symptoms: fever (103.1 yesterday morning)   Associated symptoms: no abdominal pain, no nausea and no vomiting     Past Medical History  Diagnosis Date  . Glycosuria   . GERD (gastroesophageal reflux disease)   . Schatzki's ring   . Hiatal hernia    Past Surgical History  Procedure Laterality Date  . Tonsillectomy    . Foot surgery Right    Family History  Problem Relation Age of Onset  . Heart disease Father    History  Substance Use Topics  . Smoking status: Former Games developermoker  . Smokeless tobacco: Never Used  .  Alcohol Use: Yes    Review of Systems  Constitutional: Positive for fever (103.1 yesterday morning) and chills.  Respiratory: Positive for cough (dry, nonproductive). Negative for choking and shortness of breath.   Gastrointestinal: Negative for nausea, vomiting, abdominal pain and diarrhea.  Psychiatric/Behavioral: Positive for confusion.  All other systems reviewed and are negative.     Allergies  Erythromycin  Home Medications   Prior to Admission medications   Medication Sig Start Date End Date Taking? Authorizing Provider  chlorpheniramine-HYDROcodone (TUSSIONEX PENNKINETIC ER) 10-8 MG/5ML LQCR Take 5 mLs by mouth every 12 (twelve) hours as needed for cough. 12/24/14   Graylon GoodZachary H Baker, PA-C  ibuprofen (ADVIL,MOTRIN) 200 MG tablet Take 200 mg by mouth every 6 (six) hours as needed for pain.    Historical Provider, MD  pantoprazole (PROTONIX) 40 MG tablet Take 1 tablet (40 mg total) by mouth daily. 06/07/13   Mardella Laymanavid R Patterson, MD   BP 163/110 mmHg  Pulse 90  Temp(Src) 98.4 F (36.9 C) (Oral)  Resp 20  Ht 5\' 9"  (1.753 m)  Wt 190 lb (86.183 kg)  BMI 28.05 kg/m2  SpO2 97% Physical Exam  Constitutional: He is oriented to person, place, and time. He appears well-developed and well-nourished. No distress.  HENT:  Head: Normocephalic and atraumatic.  Mouth/Throat: Posterior oropharyngeal erythema (patchy in posterior pharynx, small areas on tongue with circular clearing) present. No oropharyngeal exudate or posterior oropharyngeal edema.  Eyes: EOM are normal. Pupils are equal, round, and reactive to light.  Neck: Normal range of motion. Neck supple.  Cardiovascular: Normal rate and regular rhythm.  Exam reveals no friction rub.   No murmur heard. Pulmonary/Chest: Effort normal and breath sounds normal. No respiratory distress. He has no wheezes. He has no rales.  Abdominal: Soft. He exhibits no distension. There is no tenderness. There is no rebound.  Musculoskeletal: Normal  range of motion. He exhibits no edema.  Neurological: He is alert and oriented to person, place, and time. No cranial nerve deficit. He exhibits normal muscle tone. Coordination normal.  Skin: No rash noted. He is not diaphoretic.    ED Course  Procedures (including critical care time) Labs Review Labs Reviewed - No data to display  Imaging Review Dg Chest 2 View  12/25/2014   CLINICAL DATA:  Fever, sore throat, nonproductive cough. Symptoms for 2 days.  EXAM: CHEST  2 VIEW  COMPARISON:  None.  FINDINGS: The cardiomediastinal contours are normal. The lungs are clear. Pulmonary vasculature is normal. No consolidation, pleural effusion, or pneumothorax. No acute osseous abnormalities are seen.  IMPRESSION: No acute pulmonary process.   Electronically Signed   By: Rubye Oaks M.D.   On: 12/25/2014 00:09   Ct Head Wo Contrast  12/25/2014   CLINICAL DATA:  Acute onset of confusion and difficulty speaking. Hypertension. Sore throat. Slurred speech. Initial encounter.  EXAM: CT HEAD WITHOUT CONTRAST  TECHNIQUE: Contiguous axial images were obtained from the base of the skull through the vertex without intravenous contrast.  COMPARISON:  None.  FINDINGS: There is no evidence of acute infarction, mass lesion, or intra- or extra-axial hemorrhage on CT.  The posterior fossa, including the cerebellum, brainstem and fourth ventricle, is within normal limits. The third and lateral ventricles, and basal ganglia are unremarkable in appearance. The cerebral hemispheres are symmetric in appearance, with normal gray-white differentiation. No mass effect or midline shift is seen.  There is no evidence of fracture; visualized osseous structures are unremarkable in appearance. The orbits are within normal limits. Significant mucosal thickening is noted at the maxillary sinuses bilaterally. The remaining paranasal sinuses and mastoid air cells are well-aerated. No significant soft tissue abnormalities are seen.   IMPRESSION: 1. No acute intracranial pathology seen on CT. 2. Significant mucosal thickening at the maxillary sinuses bilaterally.   Electronically Signed   By: Roanna Raider M.D.   On: 12/25/2014 00:20   Mr Brain Wo Contrast  12/25/2014   CLINICAL DATA:  Initial evaluation for acute right-sided numbness, aphasia. New onset altered mental status.  EXAM: MRI HEAD WITHOUT CONTRAST  TECHNIQUE: Multiplanar, multiecho pulse sequences of the brain and surrounding structures were obtained without intravenous contrast.  COMPARISON:  Prior CT from earlier the same day.  FINDINGS: The CSF containing spaces are within normal limits for patient age. No focal parenchymal signal abnormality is identified. No mass lesion, midline shift, or extra-axial fluid collection. Ventricles are normal in size without evidence of hydrocephalus.  No diffusion-weighted signal abnormality is identified to suggest acute intracranial infarct. Gray-white matter differentiation is maintained. Normal flow voids are seen within the intracranial vasculature. No intracranial hemorrhage identified.  The cervicomedullary junction is normal. Pituitary gland is within normal limits. Pituitary stalk is midline. The globes and optic nerves demonstrate a normal appearance with normal signal intensity.  The bone marrow signal intensity is normal. Calvarium is intact. Visualized upper cervical spine is within normal limits.  Scalp soft tissues are unremarkable.  Mild scattered mucosal thickening present within the maxillary sinuses bilaterally as well as  the ethmoidal air cells.  IMPRESSION: 1. Normal brain MRI with no acute intracranial abnormality identified. 2. Mild mucosal thickening throughout the paranasal sinuses as above.   Electronically Signed   By: Rise Mu M.D.   On: 12/25/2014 03:52     EKG Interpretation None      MDM   Final diagnoses:  Fever  Confusion  Numbness  Medication reaction, initial encounter    29M here  with sore throat, confusion. Had intermittent fever past few days. No stupor or coma with his altered mental status. I observed an episode, he was speaking to me and then had some confusion and mild aphasia.  Afebrile here, no meningeal signs. Ulcerative lesions in his mouth.  Will start with labs, Head CT. Will consult Neuro in case this is a TIA or encephalitis. Doubt meningitis.  Dr. Amada Jupiter with Neuro thinks an LP is reasonable, will plan for LP here.  LP negative. Neuro requested MRI, which was negative also.  Likely had some confusion/altered mental status due to delsym. On repeat exam, intermittent episodes of confusion improving. Stable for discharge.  Elwin Mocha, MD 12/25/14 [phone]

## 2014-12-24 NOTE — ED Notes (Signed)
Dr Gwendolyn GrantWalden given a copy of the lactic acid results 2.22

## 2014-12-24 NOTE — ED Notes (Signed)
Patient presents via EMS with C/O sore throat, Hypertension, confusion and difficulty speaking. This observer noticed slurred speech, altered mental status intermittently as the patient is speaking. MD made aware. BP is elevated, this is not baseline for patient. 176/118.

## 2014-12-24 NOTE — ED Notes (Signed)
C/o sore throat.  Fever up to 103.  Non productive cough.  Denies n/v/d.  Symptoms on set 3/25

## 2014-12-24 NOTE — ED Notes (Signed)
Patient transported to X-ray 

## 2014-12-25 ENCOUNTER — Emergency Department (HOSPITAL_COMMUNITY): Payer: Managed Care, Other (non HMO)

## 2014-12-25 DIAGNOSIS — R509 Fever, unspecified: Secondary | ICD-10-CM

## 2014-12-25 DIAGNOSIS — G9340 Encephalopathy, unspecified: Secondary | ICD-10-CM

## 2014-12-25 LAB — CSF CELL COUNT WITH DIFFERENTIAL
EOS CSF: NONE SEEN % (ref 0–1)
Eosinophils, CSF: NONE SEEN % (ref 0–1)
RBC COUNT CSF: 8 /mm3 — AB
RBC Count, CSF: 0 /mm3
Segmented Neutrophils-CSF: NONE SEEN % (ref 0–6)
Segmented Neutrophils-CSF: NONE SEEN % (ref 0–6)
Tube #: 1
Tube #: 4
WBC CSF: 1 /mm3 (ref 0–5)
WBC, CSF: 1 /mm3 (ref 0–5)

## 2014-12-25 LAB — BASIC METABOLIC PANEL
ANION GAP: 12 (ref 5–15)
BUN: 8 mg/dL (ref 6–23)
CALCIUM: 9 mg/dL (ref 8.4–10.5)
CO2: 24 mmol/L (ref 19–32)
CREATININE: 1.03 mg/dL (ref 0.50–1.35)
Chloride: 99 mmol/L (ref 96–112)
GFR calc non Af Amer: 85 mL/min — ABNORMAL LOW (ref >90)
Glucose, Bld: 158 mg/dL — ABNORMAL HIGH (ref 70–99)
Potassium: 3.1 mmol/L — ABNORMAL LOW (ref 3.5–5.1)
Sodium: 135 mmol/L (ref 135–145)

## 2014-12-25 LAB — URINALYSIS, ROUTINE W REFLEX MICROSCOPIC
Bilirubin Urine: NEGATIVE
Glucose, UA: >1000 mg/dL — AB
Hgb urine dipstick: NEGATIVE
Ketones, ur: 15 mg/dL — AB
LEUKOCYTES UA: NEGATIVE
Nitrite: NEGATIVE
Protein, ur: NEGATIVE mg/dL
SPECIFIC GRAVITY, URINE: 1.02 (ref 1.005–1.030)
UROBILINOGEN UA: 1 mg/dL (ref 0.0–1.0)
pH: 5.5 (ref 5.0–8.0)

## 2014-12-25 LAB — GRAM STAIN

## 2014-12-25 LAB — I-STAT TROPONIN, ED: Troponin i, poc: 0 ng/mL (ref 0.00–0.08)

## 2014-12-25 LAB — URINE MICROSCOPIC-ADD ON

## 2014-12-25 LAB — GLUCOSE, CSF: GLUCOSE CSF: 71 mg/dL (ref 43–76)

## 2014-12-25 LAB — PROTEIN, CSF: Total  Protein, CSF: 39 mg/dL (ref 15–45)

## 2014-12-25 MED ORDER — ONDANSETRON HCL 4 MG/2ML IJ SOLN
INTRAMUSCULAR | Status: AC
  Administered 2014-12-25: 4 mg
  Filled 2014-12-25: qty 2

## 2014-12-25 MED ORDER — ACETAMINOPHEN 500 MG PO TABS
1000.0000 mg | ORAL_TABLET | Freq: Once | ORAL | Status: AC
  Administered 2014-12-25: 1000 mg via ORAL
  Filled 2014-12-25: qty 2

## 2014-12-25 MED ORDER — SODIUM CHLORIDE 0.9 % IV BOLUS (SEPSIS)
1000.0000 mL | Freq: Once | INTRAVENOUS | Status: AC
  Administered 2014-12-25: 1000 mL via INTRAVENOUS

## 2014-12-25 MED ORDER — LIDOCAINE HCL (PF) 1 % IJ SOLN
INTRAMUSCULAR | Status: AC
  Filled 2014-12-25: qty 10

## 2014-12-25 MED ORDER — LORAZEPAM 2 MG/ML IJ SOLN
1.0000 mg | Freq: Once | INTRAMUSCULAR | Status: AC
  Administered 2014-12-25: 1 mg via INTRAVENOUS
  Filled 2014-12-25: qty 1

## 2014-12-25 NOTE — Discharge Instructions (Signed)
Confusion Confusion is the inability to think with your usual speed or clarity. Confusion may come on quickly or slowly over time. How quickly the confusion comes on depends on the cause. Confusion can be due to any number of causes. CAUSES   Concussion, head injury, or head trauma.  Seizures.  Stroke.  Fever.  Brain tumor.  Age related decreased brain function (dementia).  Heightened emotional states like rage or terror.  Mental illness in which the person loses the ability to determine what is real and what is not (hallucinations).  Infections such as a urinary tract infection (UTI).  Toxic effects from alcohol, drugs, or prescription medicines.  Dehydration and an imbalance of salts in the body (electrolytes).  Lack of sleep.  Low blood sugar (diabetes).  Low levels of oxygen from conditions such as chronic lung disorders.  Drug interactions or other medicine side effects.  Nutritional deficiencies, especially niacin, thiamine, vitamin C, or vitamin B.  Sudden drop in body temperature (hypothermia).  Change in routine, such as when traveling or hospitalized. SIGNS AND SYMPTOMS  People often describe their thinking as cloudy or unclear when they are confused. Confusion can also include feeling disoriented. That means you are unaware of where or who you are. You may also not know what the date or time is. If confused, you may also have difficulty paying attention, remembering, and making decisions. Some people also act aggressively when they are confused.  DIAGNOSIS  The medical evaluation of confusion may include:  Blood and urine tests.  X-rays.  Brain and nervous system tests.  Analyzing your brain waves (electroencephalogram or EEG).  Magnetic resonance imaging (MRI) of your head.  Computed tomography (CT) scan of your head.  Mental status tests in which your health care provider may ask many questions. Some of these questions may seem silly or strange,  but they are a very important test to help diagnose and treat confusion. TREATMENT  An admission to the hospital may not be needed, but a person with confusion should not be left alone. Stay with a family member or friend until the confusion clears. Avoid alcohol, pain relievers, or sedative drugs until you have fully recovered. Do not drive until directed by your health care provider. HOME CARE INSTRUCTIONS  What family and friends can do:  To find out if someone is confused, ask the person to state his or her name, age, and the date. If the person is unsure or answers incorrectly, he or she is confused.  Always introduce yourself, no matter how well the person knows you.  Often remind the person of his or her location.  Place a calendar and clock near the confused person.  Help the person with his or her medicines. You may want to use a pill box, an alarm as a reminder, or give the person each dose as prescribed.  Talk about current events and plans for the day.  Try to keep the environment calm, quiet, and peaceful.  Make sure the person keeps follow-up visits with his or her health care provider. PREVENTION  Ways to prevent confusion:  Avoid alcohol.  Eat a balanced diet.  Get enough sleep.  Take medicine only as directed by your health care provider.  Do not become isolated. Spend time with other people and make plans for your days.  Keep careful watch on your blood sugar levels if you are diabetic. SEEK IMMEDIATE MEDICAL CARE IF:   You develop severe headaches, repeated vomiting, seizures, blackouts, or   slurred speech.  There is increasing confusion, weakness, numbness, restlessness, or personality changes.  You develop a loss of balance, have marked dizziness, feel uncoordinated, or fall.  You have delusions, hallucinations, or develop severe anxiety.  Your family members think you need to be rechecked. Document Released: 10/23/2004 Document Revised: 01/30/2014  Document Reviewed: 10/21/2013 [employer] Patient Information 2015 [employer]. This information is not intended to replace advice given to you by your health care provider. Make sure you discuss any questions you have with your health care provider.  

## 2014-12-25 NOTE — ED Notes (Signed)
Lab called reporting on spinal fluid - no organisms and WBC's in monos  Dr Gwendolyn GrantWalden notified

## 2014-12-25 NOTE — ED Notes (Signed)
Patient transported to MRI 

## 2014-12-25 NOTE — ED Provider Notes (Signed)
LUMBAR PUNCTURE Date/Time: 12/25/2014 1:15 AM Performed by: Tilden FossaEES, Theressa Piedra Authorized by: Tilden FossaEES, Xaria Judon Consent: Verbal consent obtained. Written consent obtained. Risks and benefits: risks, benefits and alternatives were discussed Consent given by: patient Patient understanding: patient states understanding of the procedure being performed Patient identity confirmed: verbally with patient Indications: evaluation for infection Anesthesia: local infiltration Local anesthetic: lidocaine 1% without epinephrine Anesthetic total: 2 ml Patient sedated: no Preparation: Patient was prepped and draped in the usual sterile fashion. Lumbar space: L3-L4 interspace Patient's position: sitting Needle gauge: 22 Needle length: 3.5 in Fluid appearance: clear Total volume: 4 ml Post-procedure: site cleaned and adhesive bandage applied Patient tolerance: Patient tolerated the procedure well with no immediate complications Comments: Patient did have nausea at end of procedure, provided zofran and laid supine.       Tilden FossaElizabeth Leidi Astle, MD 12/25/14 [phone]

## 2014-12-25 NOTE — Consult Note (Signed)
Neurology Consultation Reason for Consult: Altered Mental Status Referring Physician: Gwendolyn Grant, B  CC: Altered Mental Status  History is obtained from: Patient  HPI: Troy Bauer is a 44 y.o. male who has been sick since Friday. Initially, he noticed a high fever subsequently had a severe sore throat. He went to urgent care and was diagnosed with ulcerative pharyngitis. This was likely to be likely viral in etiology. He went home. He was trying to get to sleep and took some Delsym in approximately 30 minutes later began having strange altered mental status. He states that this would wax and wane, sometimes he felt "dumb" and other times he felt his normal self. He also described tingling of his right arm and in fact still feels like his right arm is slightly numb.  He did have a headache with his current febrile illness.   LKW: 8:30 PM tpa given?: no, mild deficits    ROS: A 14 point ROS was performed and is negative except as noted in the HPI.   Past Medical History  Diagnosis Date  . Glycosuria   . GERD (gastroesophageal reflux disease)   . Schatzki's ring   . Hiatal hernia     Family History: No history of stroke or seizures  Social History: Tob: Quit 7 years ago  Exam: Current vital signs: BP 175/106 mmHg  Pulse 95  Temp(Src) 98.6 F (37 C) (Oral)  Resp 16  Ht  (1.753 m)  Wt 86.183 kg (190 lb)  BMI 28.05 kg/m2  SpO2 98% Vital signs in last 24 hours: Temp:  [98.4 F (36.9 C)-99 F (37.2 C)] 98.6 F (37 C) (03/27 2315) Pulse Rate:  [90-114] 95 (03/27 2315) Resp:  [16-20] 16 (03/27 2315) BP: (163-178)/(106-110) 175/106 mmHg (03/27 2315) SpO2:  [95 %-100 %] 98 % (03/27 2315) Weight:  [86.183 kg (190 lb)] 86.183 kg (190 lb) (03/27 2315)  Physical Exam  Constitutional: Appears well-developed and well-nourished.  Psych: Affect appropriate to situation Eyes: No scleral injection HENT: No OP obstrucion Head: Normocephalic.  Cardiovascular: Normal rate and  regular rhythm.  Respiratory: Effort normal and breath sounds normal to anterior ascultation GI: Soft.  No distension. There is no tenderness.  Skin: WDI  Neuro: Mental Status: Patient is awake, alert, oriented to person, place, month, year, and situation. Patient is able to give a clear and coherent history. No signs of aphasia or neglect Cranial Nerves: II: Visual Fields are full. Pupils are equal, round, and reactive to light.   III,IV, VI: EOMI without ptosis or diploplia.  V: Facial sensation is symmetric to temperature VII: Facial movement is symmetric.  VIII: hearing is intact to voice X: Uvula elevates symmetrically XI: Shoulder shrug is symmetric. XII: tongue is midline without atrophy or fasciculations.  Motor: Tone is normal. Bulk is normal. 5/5 strength was present in all four extremities.  Sensory: Sensation is symmetric to light touch and temperature in the legs. He has mild dysesthesia to light touch in the right arm Deep Tendon Reflexes: 2+ and symmetric in the biceps and patellae.  Cerebellar: FNF intact bilaterally   I have reviewed labs in epic and the results pertinent to this consultation are: Hypokalemia Elevated lactate  I have reviewed the images obtained: CT head - neg acute  Impression: 44 year old male with paresthesia and altered mental status following Delsym. The focal nature does make me more concerned and therefore I have recommended workup including lumbar puncture as well as MRI. Most likely, this represents medication effect, but  I feel that other causes do need to be ruled out.  HSV can cause an ulcerative pharyngitis as an initial outbreak and if his CSF is cellular, then I would treat for this pending the PCR. If it is negative, then I have a low enough index of suspicion that I do not think he would merit empiric treatment.  Recommendations: 1) MRI brain 2) LP for cells, protein, glucose, HSV by PCR 3) if the above are all normal, then  the patient can be discharged to home with the suspicion that this represent medication effect. If abnormalities are found, then will make further make recommendations at that time.   Ritta SlotMcNeill Kirkpatrick, MD [facility] [phone]  If 7pm- 7am, please page neurology on call as listed in [facility].

## 2014-12-25 NOTE — ED Notes (Signed)
Patient positioned sitting up with arms on bedside table for LP.  Dr Madilyn Hookees in to do procedure.  Patient tolerated procedure well.  Became nauseated when sitting up, Zofran ordered and given.  Procedure completed, tubes 1-4 with spinal fluid, labeled and transported to the lab.

## 2014-12-25 NOTE — ED Notes (Signed)
Patient states understanding for the importance of follow up care with PCP in 2-3 days. He states he does not have an established PCP. An offer for a list of PCP's was offered but the patient refused the need for a list. He stated he could look this list up with his insurance. Education on staying hydrated and reducing stress were also provided per patients questions. Wheeled to lobby. Significant other is coming to pick up patient.

## 2014-12-26 LAB — HERPES SIMPLEX VIRUS(HSV) DNA BY PCR
HSV 1 DNA: NEGATIVE
HSV 2 DNA: NEGATIVE

## 2014-12-27 LAB — CULTURE, GROUP A STREP: Strep A Culture: NEGATIVE

## 2014-12-28 LAB — CSF CULTURE: Culture: NO GROWTH

## 2014-12-28 LAB — CSF CULTURE W GRAM STAIN: Gram Stain: NONE SEEN

## 2015-08-27 ENCOUNTER — Other Ambulatory Visit: Payer: Self-pay | Admitting: Gastroenterology

## 2016-02-19 MED FILL — TRIAMCINOLONE 0.1% CREAM: 0.1 | 14 days supply | Qty: 80 | Fill #0

## 2016-03-12 MED FILL — TRIAMCINOLONE 0.1% OINTMENT: 0.1 | 30 days supply | Qty: 45 | Fill #0

## 2016-03-12 MED FILL — hydrOXYzine HCL 25 MG TABS: 25 | 15 days supply | Qty: 30 | Fill #0

## 2017-01-10 IMAGING — MR MR HEAD W/O CM
9 of 10 series · 35 of 48 positions shown · non-contrast
Comparison: Prior CT from earlier the same day.

CLINICAL DATA: Initial evaluation for acute right-sided numbness,
aphasia. New onset altered mental status.

EXAM:
MRI HEAD WITHOUT CONTRAST
TECHNIQUE: Multiplanar, multiecho pulse sequences of the brain and surrounding
structures were obtained without intravenous contrast.

[Series 3: T1 · sagittal · 5.0mm · 0.47mm/px · 1 of 22 slices shown]
[im 1/22]
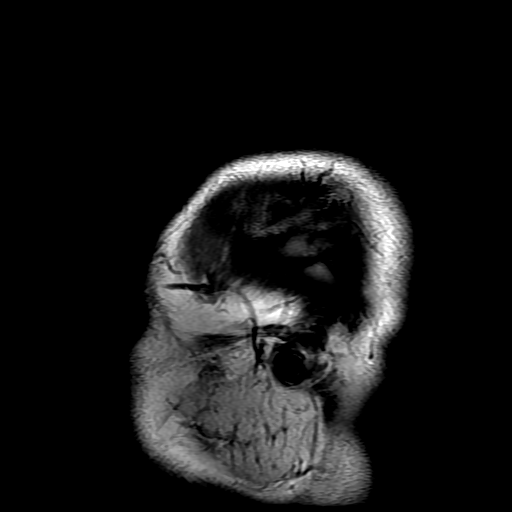

[Series 4: DWI · axial · 3.0mm · 1.09mm/px · z∈[-46,+101]mm · 8 of 100 slices shown (1 of 4)]
[im 1/100]
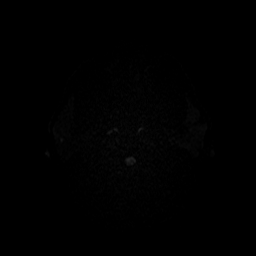
[im 12/100]
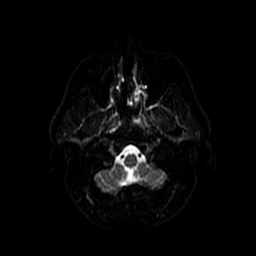
[im 34/100]
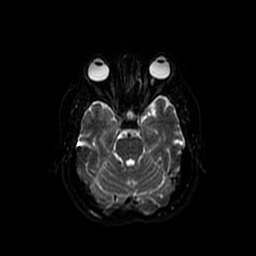
[im 45/100]
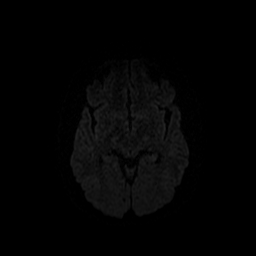
[im 56/100]
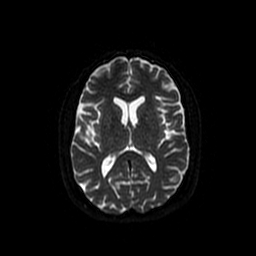
[im 67/100]
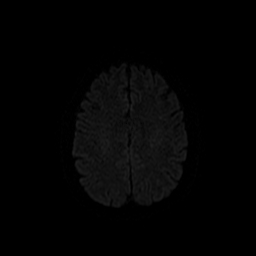
[im 89/100]
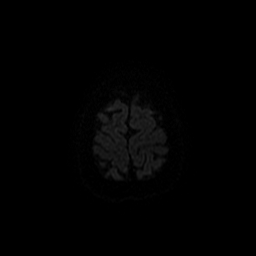
[im 100/100]
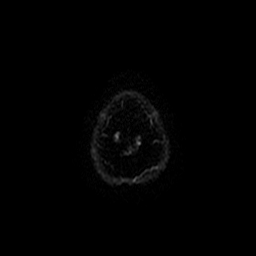

[Series 5: T2 · axial · 5.0mm · 0.43mm/px · z∈[-46,+97]mm · 3 of 25 slices shown (1 of 2)]
[im 1/25]
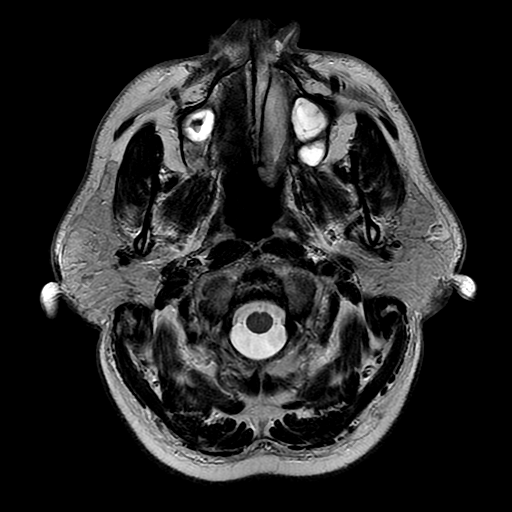
[im 13/25]
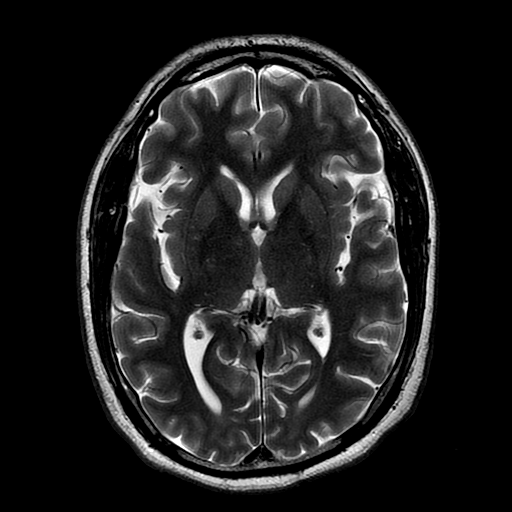
[im 25/25]
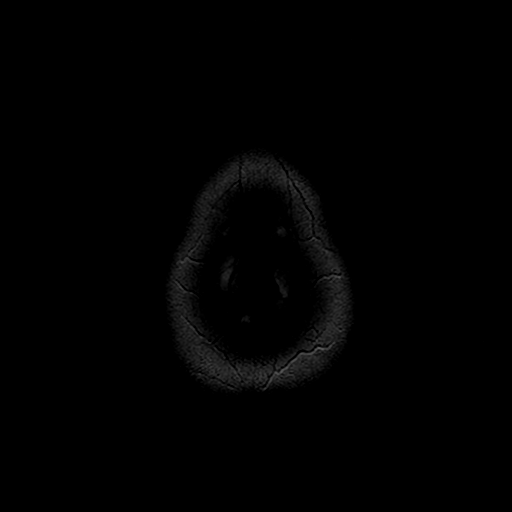

[Series 6: DWI · coronal · 5.0mm · 1.09mm/px · 7 of 66 slices shown (2 of 4)]
[im 1/66]
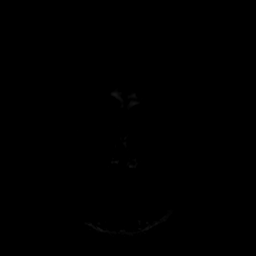
[im 11/66]
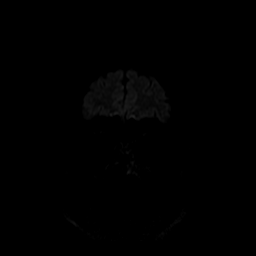
[im 22/66]
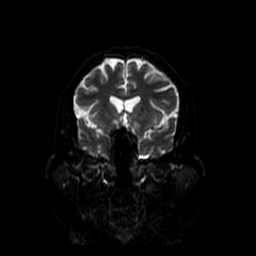
[im 33/66]
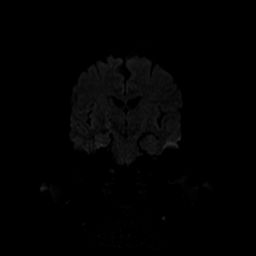
[im 44/66]
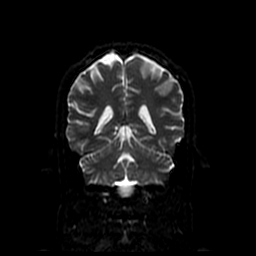
[im 55/66]
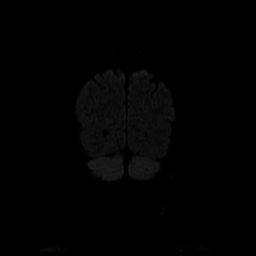
[im 66/66]
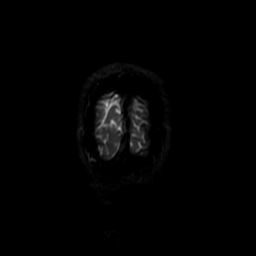

[Series 7: FLAIR · axial · 5.0mm · 0.43mm/px · z∈[-46,+97]mm · 3 of 25 slices shown]
[im 1/25]
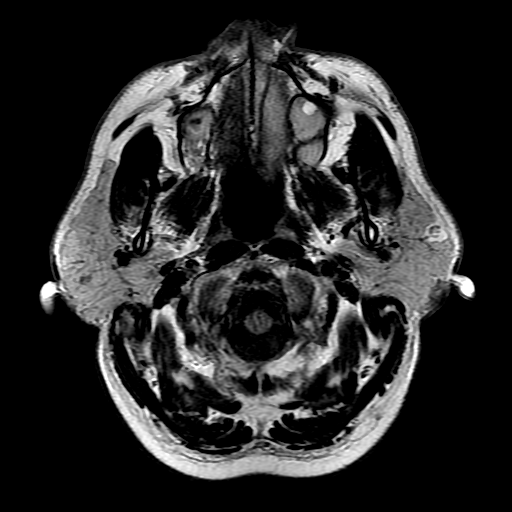
[im 13/25]
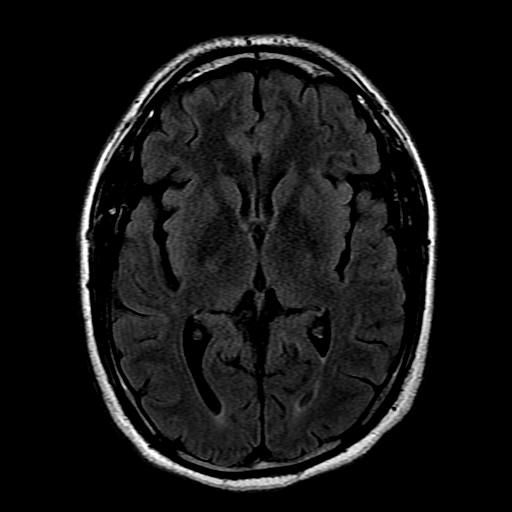
[im 25/25]
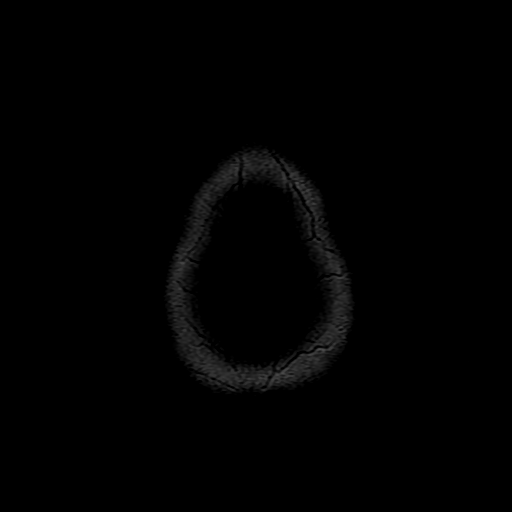

[Series 8: ax mpgr · axial · 5.0mm · 0.43mm/px · z∈[-46,+26]mm · 2 of 25 slices shown]
[im 1/25]
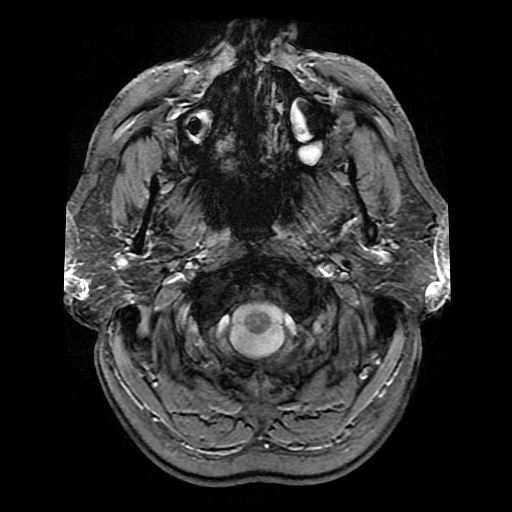
[im 13/25]
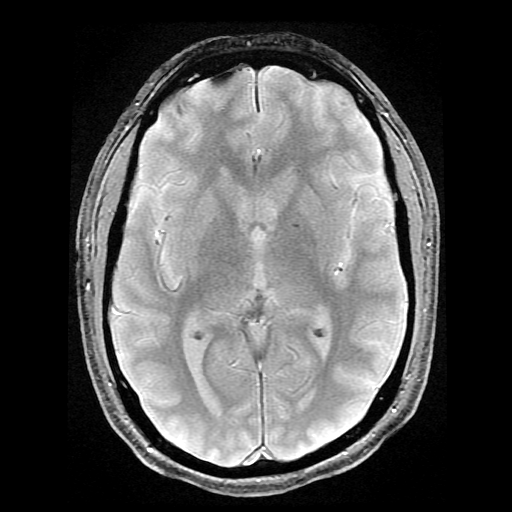

[Series 10: T2 · coronal · 5.0mm · 0.39mm/px · 3 of 25 slices shown (2 of 2)]
[im 1/25]
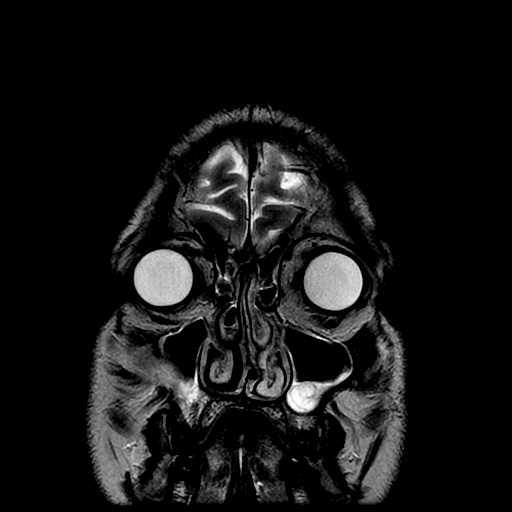
[im 13/25]
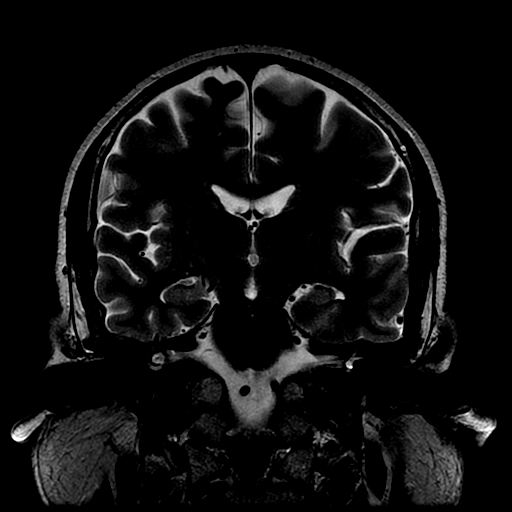
[im 25/25]
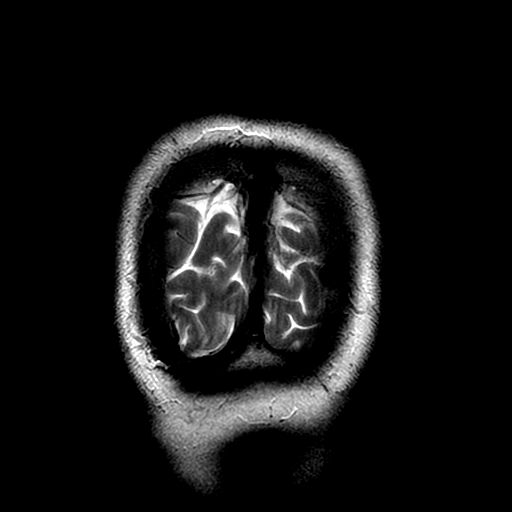

[Series 400: DWI · axial · 3.0mm · 1.09mm/px · z∈[-46,+101]mm · 5 of 50 slices shown (3 of 4)]
[im 1/50]
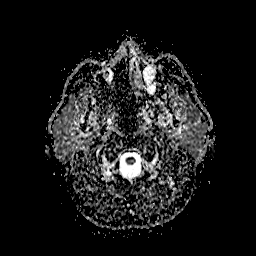
[im 13/50]
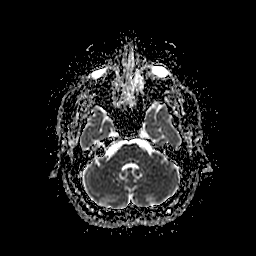
[im 25/50]
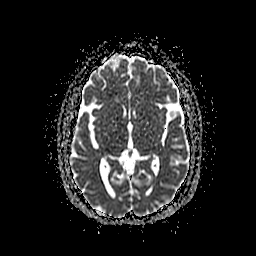
[im 37/50]
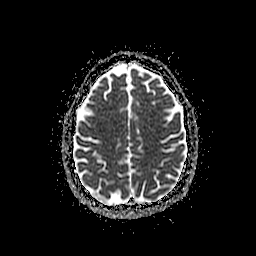
[im 50/50]
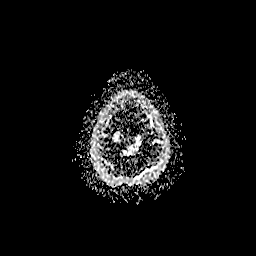

[Series 600: DWI · coronal · 5.0mm · 1.09mm/px · 3 of 33 slices shown (4 of 4)]
[im 1/33]
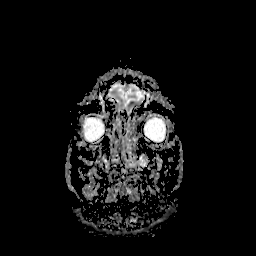
[im 17/33]
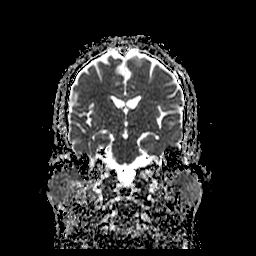
[im 33/33]
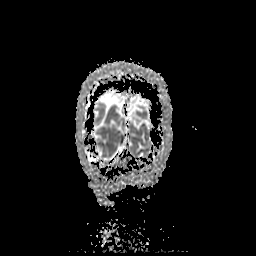

[35 of 48 positions shown; findings below may reference images not displayed]

FINDINGS: The CSF containing spaces are within normal limits for patient age.
No focal parenchymal signal abnormality is identified. No mass
lesion, midline shift, or extra-axial fluid collection. Ventricles
are normal in size without evidence of hydrocephalus.

No diffusion-weighted signal abnormality is identified to suggest
acute intracranial infarct. Gray-white matter differentiation is
maintained. Normal flow voids are seen within the intracranial
vasculature. No intracranial hemorrhage identified.

The cervicomedullary junction is normal. Pituitary gland is within
normal limits. Pituitary stalk is midline. The globes and optic
nerves demonstrate a normal appearance with normal signal intensity.

The bone marrow signal intensity is normal. Calvarium is intact.
Visualized upper cervical spine is within normal limits.

Scalp soft tissues are unremarkable.

Mild scattered mucosal thickening present within the maxillary
sinuses bilaterally as well as the ethmoidal air cells.
IMPRESSION: 1. Normal brain MRI with no acute intracranial abnormality
identified.
2. Mild mucosal thickening throughout the paranasal sinuses as
above.

## 2020-06-05 DIAGNOSIS — L723 Sebaceous cyst: Secondary | ICD-10-CM | POA: Diagnosis not present

## 2020-08-21 DIAGNOSIS — K219 Gastro-esophageal reflux disease without esophagitis: Secondary | ICD-10-CM | POA: Diagnosis not present

## 2020-08-21 DIAGNOSIS — R69 Illness, unspecified: Secondary | ICD-10-CM | POA: Diagnosis not present

## 2020-08-21 DIAGNOSIS — E7801 Familial hypercholesterolemia: Secondary | ICD-10-CM | POA: Diagnosis not present

## 2020-08-21 DIAGNOSIS — Z125 Encounter for screening for malignant neoplasm of prostate: Secondary | ICD-10-CM | POA: Diagnosis not present

## 2020-08-21 DIAGNOSIS — I10 Essential (primary) hypertension: Secondary | ICD-10-CM | POA: Diagnosis not present

## 2020-08-21 DIAGNOSIS — Z79899 Other long term (current) drug therapy: Secondary | ICD-10-CM | POA: Diagnosis not present

## 2021-03-25 DIAGNOSIS — E7801 Familial hypercholesterolemia: Secondary | ICD-10-CM | POA: Diagnosis not present

## 2021-03-25 DIAGNOSIS — Z79899 Other long term (current) drug therapy: Secondary | ICD-10-CM | POA: Diagnosis not present

## 2021-03-25 DIAGNOSIS — I10 Essential (primary) hypertension: Secondary | ICD-10-CM | POA: Diagnosis not present

## 2021-03-25 DIAGNOSIS — R69 Illness, unspecified: Secondary | ICD-10-CM | POA: Diagnosis not present

## 2021-04-29 DIAGNOSIS — R7401 Elevation of levels of liver transaminase levels: Secondary | ICD-10-CM | POA: Diagnosis not present

## 2021-07-22 DIAGNOSIS — Z23 Encounter for immunization: Secondary | ICD-10-CM | POA: Diagnosis not present

## 2022-03-05 DIAGNOSIS — L720 Epidermal cyst: Secondary | ICD-10-CM | POA: Diagnosis not present

## 2022-03-05 DIAGNOSIS — L821 Other seborrheic keratosis: Secondary | ICD-10-CM | POA: Diagnosis not present

## 2022-03-05 DIAGNOSIS — D1801 Hemangioma of skin and subcutaneous tissue: Secondary | ICD-10-CM | POA: Diagnosis not present

## 2022-05-08 DIAGNOSIS — E7801 Familial hypercholesterolemia: Secondary | ICD-10-CM | POA: Diagnosis not present

## 2022-05-08 DIAGNOSIS — Z79899 Other long term (current) drug therapy: Secondary | ICD-10-CM | POA: Diagnosis not present

## 2022-05-08 DIAGNOSIS — R69 Illness, unspecified: Secondary | ICD-10-CM | POA: Diagnosis not present

## 2022-05-08 DIAGNOSIS — K219 Gastro-esophageal reflux disease without esophagitis: Secondary | ICD-10-CM | POA: Diagnosis not present

## 2022-05-08 DIAGNOSIS — Z125 Encounter for screening for malignant neoplasm of prostate: Secondary | ICD-10-CM | POA: Diagnosis not present

## 2022-05-08 DIAGNOSIS — I10 Essential (primary) hypertension: Secondary | ICD-10-CM | POA: Diagnosis not present

## 2022-12-04 DIAGNOSIS — Z79899 Other long term (current) drug therapy: Secondary | ICD-10-CM | POA: Diagnosis not present

## 2022-12-04 DIAGNOSIS — I10 Essential (primary) hypertension: Secondary | ICD-10-CM | POA: Diagnosis not present

## 2022-12-04 DIAGNOSIS — E7801 Familial hypercholesterolemia: Secondary | ICD-10-CM | POA: Diagnosis not present

## 2022-12-04 DIAGNOSIS — F418 Other specified anxiety disorders: Secondary | ICD-10-CM | POA: Diagnosis not present

## 2023-09-18 ENCOUNTER — Telehealth: Payer: Self-pay | Admitting: *Deleted

## 2023-09-18 NOTE — Telephone Encounter (Addendum)
Error in opening chart
